# Patient Record
Sex: Male | Born: 1965 | Race: White | Hispanic: No | Marital: Married | Smoking: Never smoker
Health system: Southern US, Community
[De-identification: ages and names within clinical notes are randomized; demographics above are authoritative.]

---
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
## 1997-03-02 HISTORY — PX: HAND SURGERY: SHX662

## 2009-04-04 ENCOUNTER — Encounter: Admission: RE | Admit: 2009-04-04 | Discharge: 2009-04-04 | Payer: Self-pay | Admitting: Family Medicine

## 2010-03-23 ENCOUNTER — Encounter: Payer: Self-pay | Admitting: Family Medicine

## 2013-11-16 ENCOUNTER — Other Ambulatory Visit (HOSPITAL_BASED_OUTPATIENT_CLINIC_OR_DEPARTMENT_OTHER): Payer: Self-pay | Admitting: Family Medicine

## 2013-11-16 ENCOUNTER — Ambulatory Visit (HOSPITAL_BASED_OUTPATIENT_CLINIC_OR_DEPARTMENT_OTHER): Payer: PRIVATE HEALTH INSURANCE

## 2013-11-16 DIAGNOSIS — R3129 Other microscopic hematuria: Secondary | ICD-10-CM

## 2013-11-17 ENCOUNTER — Ambulatory Visit (HOSPITAL_BASED_OUTPATIENT_CLINIC_OR_DEPARTMENT_OTHER)
Admission: RE | Admit: 2013-11-17 | Discharge: 2013-11-17 | Disposition: A | Discharge status: Home / Self Care | Payer: PRIVATE HEALTH INSURANCE | Source: Ambulatory Visit | Attending: Family Medicine | Admitting: Family Medicine

## 2013-11-17 DIAGNOSIS — K5730 Diverticulosis of large intestine without perforation or abscess without bleeding: Secondary | ICD-10-CM | POA: Diagnosis not present

## 2013-11-17 DIAGNOSIS — M549 Dorsalgia, unspecified: Secondary | ICD-10-CM | POA: Diagnosis not present

## 2013-11-17 DIAGNOSIS — R3129 Other microscopic hematuria: Secondary | ICD-10-CM | POA: Diagnosis present

## 2017-12-28 ENCOUNTER — Encounter: Payer: Self-pay | Admitting: Cardiology

## 2017-12-28 ENCOUNTER — Ambulatory Visit (INDEPENDENT_AMBULATORY_CARE_PROVIDER_SITE_OTHER): Payer: 59 | Admitting: Cardiology

## 2017-12-28 VITALS — BP 115/75 | HR 73 | Ht 71.0 in | Wt 160.6 lb

## 2017-12-28 DIAGNOSIS — Z7189 Other specified counseling: Secondary | ICD-10-CM

## 2017-12-28 DIAGNOSIS — E7801 Familial hypercholesterolemia: Secondary | ICD-10-CM | POA: Diagnosis not present

## 2017-12-28 DIAGNOSIS — Z8249 Family history of ischemic heart disease and other diseases of the circulatory system: Secondary | ICD-10-CM

## 2017-12-28 MED ORDER — PRAVASTATIN SODIUM 20 MG PO TABS: 20.0000 mg | ORAL_TABLET | Freq: Every evening | ORAL | 0 refills | Status: DC

## 2017-12-28 NOTE — Patient Instructions (Addendum)
Medication Instructions:  Start: Pravastatin 20 mg   If you need a refill on your cardiac medications before your next appointment, please call your pharmacy.   Lab work: Your physician recommends that you return for lab work today (CRP, Lipid, LPA)  Your physician recommends that you return for lab work in 3 months (Lipid)   If you have labs (blood work) drawn today and your tests are completely normal, you will receive your results only by: Marland Kitchen MyChart Message (if you have MyChart) OR . A paper copy in the mail If you have any lab test that is abnormal or we need to change your treatment, we will call you to review the results.  Testing/Procedures: Calcium Score 1126 [address]   Follow-Up: At [facility], you and your health needs are our priority.  As part of our continuing mission to provide you with exceptional heart care, we have created designated Provider Care Teams.  These Care Teams include your primary Cardiologist (physician) and Advanced Practice Providers (APPs -  Physician Assistants and Nurse Practitioners) who all work together to provide you with the care you need, when you need it. You will need a follow up appointment in 3 months.  Please call our office 2 months in advance to schedule this appointment.  You may see Dr. Cristal Deer or one of the following Advanced Practice Providers on your designated Care Team:   Theodore Demark, PA-C . Joni Reining, DNP, ANP  Any Other Special Instructions Will Be Listed Below (If Applicable).

## 2017-12-28 NOTE — Progress Notes (Signed)
Cardiology Office Note:    Date:  12/28/2017   ID:  Martin Werner, DOB 01/03/1966, MRN [id number]  PCP:  Joycelyn Rua, MD  Cardiologist:  Jodelle Red, MD PhD  Referring MD: Joycelyn Rua, MD   CC: family history, prevention discussion  History of Present Illness:    Martin Werner is a 52 y.o. male without significant PMH who is seen as a new consult at the request of Joycelyn Rua, MD for the evaluation and management of primary prevention.  Per notes from Dr. Lenise Arena, he has a family history of heart disease. His brother has stents, and his father had 3V CABG, passed away at age 15. He wanted a stress test and EKG done for further evaluation. He himself has no personal history of heart disease. Lipids done at the office show Tchol 249, TG 78, HDL 54, LDL 180. No treatment for hypertension, no history of diabetes.  Mr. Harte brings with him a history of lipid results, all elevated and in agreement with recent panel results. He is very healthy, eating fish and fruits/vegetables predominantly. He used to run regularly, and while he has cut back on this, he is still very active. He has a very significant family history of CAD and elevated lipids. Father had a massive heart attack at age 23. His brother was first 31 when evaluated for MI, but had a later MI several years later and now has stents. Unfortunately his brother also now has bone cancer.  He is very concerned about his health. He has young children and wants to be with them and active for a long time.  He denies chest pain, shortness of breath, PND, orthopnea, LE edema, syncope, or palpitations. No history of xanthelasmas.  We discussed extensively how plaques form, how MI's occur, how family history and elevated cholesterol affect these risks, stratification options, and recommendations for management. All questions answered.  History reviewed. No pertinent past medical history.  Past Surgical History:  Procedure  Laterality Date  . HAND SURGERY  1999  . TONSILLECTOMY  1999    Current Medications: Current Outpatient Medications on File Prior to Visit  Medication Sig  . Ibuprofen (ADVIL) 200 MG CAPS Take 1 capsule by mouth as needed.   No current facility-administered medications on file prior to visit.      Allergies:   Oxycodone-acetaminophen and Tyloxapol   Social History   Socioeconomic History  . Marital status: Married    Spouse name: Not on file  . Number of children: Not on file  . Years of education: Not on file  . Highest education level: Not on file  Occupational History  . Not on file  Social Needs  . Financial resource strain: Not on file  . Food insecurity:    Worry: Not on file    Inability: Not on file  . Transportation needs:    Medical: Not on file    Non-medical: Not on file  Tobacco Use  . Smoking status: Never Smoker  . Smokeless tobacco: Never Used  Substance and Sexual Activity  . Alcohol use: Not on file  . Drug use: Not on file  . Sexual activity: Not on file  Lifestyle  . Physical activity:    Days per week: Not on file    Minutes per session: Not on file  . Stress: Not on file  Relationships  . Social connections:    Talks on phone: Not on file    Gets together: Not on file  Attends religious service: Not on file    Active member of club or organization: Not on file    Attends meetings of clubs or organizations: Not on file    Relationship status: Not on file  Other Topics Concern  . Not on file  Social History Narrative  . Not on file     Family History: The patient's family history includes Bone cancer in his brother; Breast cancer in his mother; COPD in his mother; Emphysema in his mother; Heart disease in his father; Hypothyroidism in his mother; Macular degeneration in his mother; Osteoporosis in his mother; Skin cancer in his mother; Stroke in his mother.  ROS:   Please see the history of present illness.  Additional pertinent  ROS:  Constitutional: Negative for chills, fever, night sweats, unintentional weight loss  HENT: Negative for ear pain and hearing loss.   Eyes: Negative for loss of vision and eye pain.  Respiratory: Negative for cough, sputum, shortness of breath, wheezing.   Cardiovascular: Negative for chest pain, palpitations , PND, orthopnea, lower extremity edema and claudication.  Gastrointestinal: Negative for abdominal pain, melena, and hematochezia.  Genitourinary: Negative for dysuria and hematuria.  Musculoskeletal: Negative for falls and myalgias.  Skin: Negative for itching and rash.  Neurological: Negative for focal weakness, focal sensory changes and loss of consciousness.  Endo/Heme/Allergies: Does not bruise/bleed easily.    EKGs/Labs/Other Studies Reviewed:    The following studies were reviewed today: Notes reviewed from Dr. Lenise Arena office  EKG:  EKG is ordered today.  The ekg ordered today demonstrates normal sinus rhytm  Recent Labs: No results found for requested labs within last 8760 hours.  Recent Lipid Panel No results found for: CHOL, TRIG, HDL, CHOLHDL, VLDL, LDLCALC, LDLDIRECT  Physical Exam:    VS:  BP 115/75 (BP Location: Left Arm)   Pulse 73   Ht 5\' 11"  (1.803 m)   Wt 160 lb 9.6 oz (72.8 kg)   BMI 22.40 kg/m     Wt Readings from Last 3 Encounters:  12/28/17 160 lb 9.6 oz (72.8 kg)     GEN: Well nourished, well developed in no acute distress HEENT: Normal NECK: No JVD; No carotid bruits LYMPHATICS: No lymphadenopathy CARDIAC: regular rhythm, normal S1 and S2, no murmurs, rubs, gallops. Radial and DP pulses 2+ bilaterally. RESPIRATORY:  Clear to auscultation without rales, wheezing or rhonchi  ABDOMEN: Soft, non-tender, non-distended MUSCULOSKELETAL:  No edema; No deformity  SKIN: Warm and dry NEUROLOGIC:  Alert and oriented x 3 PSYCHIATRIC:  Normal affect   ASSESSMENT:    1. Familial hypercholesterolemia   2. Family history of coronary  arteriosclerosis   3. Counseling on health promotion and disease prevention    PLAN:    1. Hypercholesterolemia, consistent with heterozygous familial hypercholesterolemia, and family history of CAD: even with excellent diet and lifestyle, his LDL is 180. His father and brother have both had Mis and elevated cholesterol. No one has had genetic testing as far as he knows.   -he is asymptomatic, so stress testing not indicated at this time  -he would like further risk stratification for CAD/MI. Discussed coronary calcium score, hs-CRP, and lp (a). He is amenable to getting these tests to guide intensity of therapy  -we discussed the mechanism of statins as well as pros/cons. He is concerned about myalgias but also wants to avoid symptomatic CAD. For now, we will start with lower dose pravastatin and monitor for tolerance and response  -if his risk stratification testing comes  out elevated, will increase the intensity of statin therapy  -if he cannot tolerate statins or cannot achieve appropriate lipid lowering, will refer to lipid clinic for evaluation for PCSK9  2. Primary prevention: he is exceeding lifestyle goals, but reviewed below -recommend heart healthy/Mediterranean diet, with whole grains, fruits, vegetable, fish, lean meats, nuts, and olive oil. Limit salt. -recommend moderate walking, 3-5 times/week for 30-50 minutes each session. Aim for at least 150 minutes.week. Goal should be pace of 3 miles/hours, or walking 1.5 miles in 30 minutes -recommend avoidance of tobacco products. Avoid excess alcohol. -Additional risk factor control:  -Diabetes: A1c is N/A, no risk factors  -Lipids: as above  -Blood pressure control: at goal on no meds  -Weight: ideal range ASCVD risk is 3.6% in 10 years, 50% lifetime risk.   We spent >40 minutes discussing his family history, personal risk factors, pathogenesis of lipid/plaque formation and MI, and options for risk stratification and  management.  Plan for follow up: 3 mos, recheck lipids on pravastatin  Medication Adjustments/Labs and Tests Ordered: Current medicines are reviewed at length with the patient today.  Concerns regarding medicines are outlined above.  Orders Placed This Encounter  Procedures  . CT CARDIAC SCORING  . Lipid Panel With LDL/HDL Ratio  . Lipoprotein A (LPA)  . CRP High sensitivity  . Lipid panel  . EKG 12-Lead   Meds ordered this encounter  Medications  . pravastatin (PRAVACHOL) 20 MG tablet    Sig: Take 1 tablet (20 mg total) by mouth every evening.    Dispense:  90 tablet    Refill:  0    Patient Instructions  Medication Instructions:  Start: Pravastatin 20 mg   If you need a refill on your cardiac medications before your next appointment, please call your pharmacy.   Lab work: Your physician recommends that you return for lab work today (CRP, Lipid, LPA)  Your physician recommends that you return for lab work in 3 months (Lipid)   If you have labs (blood work) drawn today and your tests are completely normal, you will receive your results only by: Marland Kitchen MyChart Message (if you have MyChart) OR . A paper copy in the mail If you have any lab test that is abnormal or we need to change your treatment, we will call you to review the results.  Testing/Procedures: Calcium Score [address]   Follow-Up: At [facility], you and your health needs are our priority.  As part of our continuing mission to provide you with exceptional heart care, we have created designated Provider Care Teams.  These Care Teams include your primary Cardiologist (physician) and Advanced Practice Providers (APPs -  Physician Assistants and Nurse Practitioners) who all work together to provide you with the care you need, when you need it. You will need a follow up appointment in 3 months.  Please call our office 2 months in advance to schedule this appointment.  You may see Dr. Cristal Deer  or one of the following Advanced Practice Providers on your designated Care Team:   Theodore Demark, PA-C . Joni Reining, DNP, ANP  Any Other Special Instructions Will Be Listed Below (If Applicable).      Signed, Jodelle Red, MD PhD 12/28/2017 1:32 PM    [facility]

## 2017-12-29 LAB — LIPID PANEL WITH LDL/HDL RATIO
CHOLESTEROL TOTAL: 236 mg/dL — AB (ref 100–199)
HDL: 52 mg/dL (ref >39)
LDL Calculated: 157 mg/dL — ABNORMAL HIGH (ref 0–99)
LDL/HDL RATIO: 3 ratio (ref 0.0–3.6)
Triglycerides: 135 mg/dL (ref 0–149)
VLDL CHOLESTEROL CAL: 27 mg/dL (ref 5–40)

## 2017-12-29 LAB — HIGH SENSITIVITY CRP: CRP HIGH SENSITIVITY: 0.83 mg/L (ref 0.00–3.00)

## 2017-12-29 LAB — LIPOPROTEIN A (LPA): LIPOPROTEIN (A): 97.9 nmol/L — AB (ref <75.0)

## 2017-12-30 ENCOUNTER — Telehealth: Payer: Self-pay | Admitting: Cardiology

## 2017-12-30 NOTE — Telephone Encounter (Signed)
Pt updated with test results along with Dr. Di Kindle recommendation. Pt verbalized understanding. Pt also states he would like Dr. Cristal Deer to know that in her notes it mention both brothers have a heart conditions but the brother who has cancer does not. Pt also states the year he had his tonsillectomy was in 1975. Chart updated.

## 2017-12-30 NOTE — Telephone Encounter (Signed)
Follow up: ° ° °Patient returning call back concerning results. °

## 2018-01-10 ENCOUNTER — Ambulatory Visit (INDEPENDENT_AMBULATORY_CARE_PROVIDER_SITE_OTHER)
Admission: RE | Admit: 2018-01-10 | Discharge: 2018-01-10 | Disposition: A | Discharge status: Home / Self Care | Payer: 59 | Source: Ambulatory Visit | Attending: Cardiology | Admitting: Cardiology

## 2018-01-10 DIAGNOSIS — E7801 Familial hypercholesterolemia: Secondary | ICD-10-CM

## 2018-01-10 DIAGNOSIS — Z8249 Family history of ischemic heart disease and other diseases of the circulatory system: Secondary | ICD-10-CM

## 2018-03-20 ENCOUNTER — Other Ambulatory Visit: Payer: Self-pay | Admitting: Cardiology

## 2018-03-20 DIAGNOSIS — E7801 Familial hypercholesterolemia: Secondary | ICD-10-CM

## 2018-03-20 DIAGNOSIS — Z8249 Family history of ischemic heart disease and other diseases of the circulatory system: Secondary | ICD-10-CM

## 2018-03-30 ENCOUNTER — Ambulatory Visit: Payer: 59 | Admitting: Cardiology

## 2018-04-06 ENCOUNTER — Ambulatory Visit (INDEPENDENT_AMBULATORY_CARE_PROVIDER_SITE_OTHER): Payer: 59 | Admitting: Cardiology

## 2018-04-06 ENCOUNTER — Encounter: Payer: Self-pay | Admitting: Cardiology

## 2018-04-06 VITALS — BP 98/74 | HR 76 | Ht 72.0 in | Wt 160.0 lb

## 2018-04-06 DIAGNOSIS — Z712 Person consulting for explanation of examination or test findings: Secondary | ICD-10-CM | POA: Diagnosis not present

## 2018-04-06 DIAGNOSIS — E7801 Familial hypercholesterolemia: Secondary | ICD-10-CM

## 2018-04-06 DIAGNOSIS — Z7189 Other specified counseling: Secondary | ICD-10-CM

## 2018-04-06 DIAGNOSIS — Z79899 Other long term (current) drug therapy: Secondary | ICD-10-CM

## 2018-04-06 LAB — HEPATIC FUNCTION PANEL
ALBUMIN: 4.9 g/dL (ref 4.0–5.0)
ALT: 30 IU/L (ref 0–44)
AST: 22 IU/L (ref 0–40)
Alkaline Phosphatase: 64 IU/L (ref 39–117)
Bilirubin Total: 1.5 mg/dL — ABNORMAL HIGH (ref 0.0–1.2)
Bilirubin, Direct: 0.26 mg/dL (ref 0.00–0.40)
TOTAL PROTEIN: 7.2 g/dL (ref 6.0–8.5)

## 2018-04-06 LAB — LIPID PANEL
CHOLESTEROL TOTAL: 204 mg/dL — AB (ref 100–199)
Chol/HDL Ratio: 3.6 ratio (ref 0.0–5.0)
HDL: 56 mg/dL (ref >39)
LDL CALC: 132 mg/dL — AB (ref 0–99)
Triglycerides: 78 mg/dL (ref 0–149)
VLDL CHOLESTEROL CAL: 16 mg/dL (ref 5–40)

## 2018-04-06 NOTE — Patient Instructions (Signed)
Medication Instructions:  Your Physician recommend you continue on your current medication as directed.    If you need a refill on your cardiac medications before your next appointment, please call your pharmacy.   Lab work: Your physician recommends that you return for lab work today (lipid, LFT)  If you have labs (blood work) drawn today and your tests are completely normal, you will receive your results only by: . MyChart Message (if you have MyChart) OR . A paper copy in the mail If you have any lab test that is abnormal or we need to change your treatment, we will call you to review the results.  Testing/Procedures: None  Follow-Up: At [facility], you and your health needs are our priority.  As part of our continuing mission to provide you with exceptional heart care, we have created designated Provider Care Teams.  These Care Teams include your primary Cardiologist (physician) and Advanced Practice Providers (APPs -  Physician Assistants and Nurse Practitioners) who all work together to provide you with the care you need, when you need it. You will need a follow up appointment in 1 years.  Please call our office 2 months in advance to schedule this appointment.  You may see Bridgette Christopher, MD or one of the following Advanced Practice Providers on your designated Care Team:   Rhonda Barrett, PA-C . Kathryn Lawrence, DNP, ANP       

## 2018-04-06 NOTE — Progress Notes (Signed)
Cardiology Office Note:    Date:  04/06/2018   ID:  [name], DOB February 16, 1966, MRN [id number]  PCP:  Joycelyn Rua, MD  Cardiologist:  Jodelle Red, MD PhD  Referring MD: Joycelyn Rua, MD   CC: family history, prevention discussion  History of Present Illness:    Martin Werner is a 53 y.o. male seen for follow up of family history of CAD and primary prevention.  Cardiac history:  Strong family history, brother had stents (first MI age 70, stents after MI several years later), and his father had massive MI at age 20, then 3V CABG, passed away at age 48. Personal lipids:  Tchol 249, TG 78, HDL 54, LDL 180. No treatment for hypertension, no history of diabetes. At initial visit, hs-CRP normal at 0.83, lp(a) elevated at 97.9. Calcium score of 33, which was 81st percentile for age/gender control group.  Today: tolerating pravastatin. No increase in his baseline aches and pains. Brother died of cancer in 2023-02-26, has been a stressful and busy time. Did well with colonoscopy last week, no issues. Reviewed results of workup after prior visit, plans for monitoring and maintenance.  Past Surgical History:  Procedure Laterality Date  . HAND SURGERY  1999  . TONSILLECTOMY  1975    Current Medications: Current Outpatient Medications on File Prior to Visit  Medication Sig  . Ibuprofen (ADVIL) 200 MG CAPS Take 1 capsule by mouth as needed.  . pravastatin (PRAVACHOL) 20 MG tablet TAKE 1 TABLET BY MOUTH EVERY DAY IN THE EVENING   No current facility-administered medications on file prior to visit.      Allergies:   Oxycodone-acetaminophen and Tyloxapol   Social History   Socioeconomic History  . Marital status: Married    Spouse name: Not on file  . Number of children: Not on file  . Years of education: Not on file  . Highest education level: Not on file  Occupational History  . Not on file  Social Needs  . Financial resource strain: Not on file  . Food insecurity:   Worry: Not on file    Inability: Not on file  . Transportation needs:    Medical: Not on file    Non-medical: Not on file  Tobacco Use  . Smoking status: Never Smoker  . Smokeless tobacco: Never Used  Substance and Sexual Activity  . Alcohol use: Not on file  . Drug use: Not on file  . Sexual activity: Not on file  Lifestyle  . Physical activity:    Days per week: Not on file    Minutes per session: Not on file  . Stress: Not on file  Relationships  . Social connections:    Talks on phone: Not on file    Gets together: Not on file    Attends religious service: Not on file    Active member of club or organization: Not on file    Attends meetings of clubs or organizations: Not on file    Relationship status: Not on file  Other Topics Concern  . Not on file  Social History Narrative  . Not on file     Family History: The patient's family history includes Bone cancer in his brother; Breast cancer in his mother; COPD in his mother; Emphysema in his mother; Heart disease in his father; Hypothyroidism in his mother; Macular degeneration in his mother; Osteoporosis in his mother; Skin cancer in his mother; Stroke in his mother.  ROS:   Please see the history  of present illness.  Additional pertinent ROS:  Constitutional: Negative for chills, fever, night sweats, unintentional weight loss  HENT: Negative for ear pain and hearing loss.   Eyes: Negative for loss of vision and eye pain.  Respiratory: Negative for cough, sputum, shortness of breath, wheezing.   Cardiovascular: Negative for chest pain, palpitations , PND, orthopnea, lower extremity edema and claudication.  Gastrointestinal: Negative for abdominal pain, melena, and hematochezia.  Genitourinary: Negative for dysuria and hematuria.  Musculoskeletal: Negative for falls and myalgias.  Skin: Negative for itching and rash.  Neurological: Negative for focal weakness, focal sensory changes and loss of consciousness.    Endo/Heme/Allergies: Does not bruise/bleed easily.    EKGs/Labs/Other Studies Reviewed:    The following studies were reviewed today: Notes reviewed from Dr. Lenise Arena office  EKG:  EKG is personally reviewed today.  The ekg ordered 12/28/17 demonstrates normal sinus rhytm  Recent Labs: No results found for requested labs within last 8760 hours.  Recent Lipid Panel    Component Value Date/Time   CHOL 236 (H) 12/28/2017 1435   TRIG 135 12/28/2017 1435   HDL 52 12/28/2017 1435   LDLCALC 157 (H) 12/28/2017 1435    Physical Exam:    VS:  BP 98/74 (BP Location: Left Arm, Patient Position: Sitting, Cuff Size: Normal)   Pulse 76   Ht 6' (1.829 m)   Wt 160 lb (72.6 kg)   BMI 21.70 kg/m     Wt Readings from Last 3 Encounters:  04/06/18 160 lb (72.6 kg)  12/28/17 160 lb 9.6 oz (72.8 kg)     GEN: Well nourished, well developed in no acute distress HEENT: Normal NECK: No JVD; No carotid bruits LYMPHATICS: No lymphadenopathy CARDIAC: regular rhythm, normal S1 and S2, no murmurs, rubs, gallops. Radial and DP pulses 2+ bilaterally. RESPIRATORY:  Clear to auscultation without rales, wheezing or rhonchi  ABDOMEN: Soft, non-tender, non-distended MUSCULOSKELETAL:  No edema; No deformity  SKIN: Warm and dry NEUROLOGIC:  Alert and oriented x 3 PSYCHIATRIC:  Normal affect   ASSESSMENT:    No diagnosis found. PLAN:    1. Hypercholesterolemia, consistent with heterozygous familial hypercholesterolemia, and family history of CAD: -even with excellent lifestyle, baseline LDL has been 180 -father and brother have both had Mis and elevated cholesterol. -No one has had genetic testing as far as he knows.  -calcium score of 33, 81st percentile for age. Reviewed test results today in person -hs-CRP is normal 0.83 -lp(a) was elevated at 97.9 -tolerating low dose pravastatin, recheck lipids -if he cannot tolerate statins or cannot achieve appropriate lipid lowering, will refer to lipid  clinic for evaluation for PCSK9  2. Primary prevention: he is exceeding lifestyle goals, but reviewed below -recommend heart healthy/Mediterranean diet, with whole grains, fruits, vegetable, fish, lean meats, nuts, and olive oil. Limit salt. -recommend moderate walking, 3-5 times/week for 30-50 minutes each session. Aim for at least 150 minutes.week. Goal should be pace of 3 miles/hours, or walking 1.5 miles in 30 minutes -recommend avoidance of tobacco products. Avoid excess alcohol. -Additional risk factor control:  -Diabetes: A1c is N/A, no risk factors  -Lipids: as above  -Blood pressure control: at goal on no meds  -Weight: ideal range  UPDATED: lipids show modest improvement in LDL (from 157 to 132). Moderate goal LDL around 100 given presence of coronary calcification but score <100. Stringent control would be LDL <70. Will trial higher dose of pravastatin, but I suspect that this will not get to target. Likely will  need a higher intensity statin at follow up.  Plan for follow up: 3 mos lipid recheck, then follow up in 1 year or sooner PRN  TIME SPENT WITH PATIENT: >25 minutes of direct patient care. More than 50% of that time was spent on coordination of care and counseling regarding test results, recommended management and prevention counseling.  Jodelle RedBridgette Carlyn Mullenbach, MD, PhD [place]  [facility]   Medication Adjustments/Labs and Tests Ordered: Current medicines are reviewed at length with the patient today.  Concerns regarding medicines are outlined above.  Orders Placed This Encounter  Procedures  . Lipid panel  . Hepatic function panel   No orders of the defined types were placed in this encounter.   Patient Instructions  Medication Instructions:  Your Physician recommend you continue on your current medication as directed.    If you need a refill on your cardiac medications before your next appointment, please call your pharmacy.   Lab work: Your physician  recommends that you return for lab work today (lipid, LFT)  If you have labs (blood work) drawn today and your tests are completely normal, you will receive your results only by: Marland Kitchen. MyChart Message (if you have MyChart) OR . A paper copy in the mail If you have any lab test that is abnormal or we need to change your treatment, we will call you to review the results.  Testing/Procedures: None  Follow-Up: At [facility], you and your health needs are our priority.  As part of our continuing mission to provide you with exceptional heart care, we have created designated Provider Care Teams.  These Care Teams include your primary Cardiologist (physician) and Advanced Practice Providers (APPs -  Physician Assistants and Nurse Practitioners) who all work together to provide you with the care you need, when you need it. You will need a follow up appointment in 1 years.  Please call our office 2 months in advance to schedule this appointment.  You may see Jodelle RedBridgette Cimone Fahey, MD or one of the following Advanced Practice Providers on your designated Care Team:   Theodore DemarkRhonda Barrett, PA-C . Joni ReiningKathryn Lawrence, DNP, ANP       Signed, Jodelle RedBridgette Manasseh Pittsley, MD PhD 04/06/2018 8:02 AM    [facility]

## 2018-04-08 ENCOUNTER — Other Ambulatory Visit: Payer: Self-pay

## 2018-04-08 DIAGNOSIS — Z8249 Family history of ischemic heart disease and other diseases of the circulatory system: Secondary | ICD-10-CM

## 2018-04-08 DIAGNOSIS — E7801 Familial hypercholesterolemia: Secondary | ICD-10-CM

## 2018-04-08 MED ORDER — PRAVASTATIN SODIUM 40 MG PO TABS: 40.0000 mg | ORAL_TABLET | Freq: Every day | ORAL | 11 refills | Status: DC

## 2018-04-12 ENCOUNTER — Encounter: Payer: Self-pay | Admitting: Cardiology

## 2018-06-14 NOTE — Telephone Encounter (Signed)
Can you change Mr. Aydelott pravastatin back to 20 mg dose? He doesn't need a refill yet. And can we have him come to see [place] in 3-4 mos? Thanks!

## 2018-06-15 ENCOUNTER — Other Ambulatory Visit: Payer: Self-pay

## 2018-06-15 DIAGNOSIS — E7801 Familial hypercholesterolemia: Secondary | ICD-10-CM

## 2018-06-15 DIAGNOSIS — Z8249 Family history of ischemic heart disease and other diseases of the circulatory system: Secondary | ICD-10-CM

## 2018-06-15 MED ORDER — PRAVASTATIN SODIUM 20 MG PO TABS: 20.0000 mg | ORAL_TABLET | Freq: Every day | ORAL | 3 refills | Status: DC

## 2018-10-31 ENCOUNTER — Other Ambulatory Visit: Payer: Self-pay

## 2018-10-31 DIAGNOSIS — E7801 Familial hypercholesterolemia: Secondary | ICD-10-CM

## 2018-10-31 DIAGNOSIS — Z8249 Family history of ischemic heart disease and other diseases of the circulatory system: Secondary | ICD-10-CM

## 2018-10-31 MED ORDER — PRAVASTATIN SODIUM 20 MG PO TABS: 20.0000 mg | ORAL_TABLET | Freq: Every day | ORAL | 3 refills | Status: DC

## 2019-04-11 ENCOUNTER — Telehealth: Payer: Self-pay | Admitting: *Deleted

## 2019-04-11 NOTE — Telephone Encounter (Signed)
A message was left, re: his  Visit.

## 2019-04-21 ENCOUNTER — Encounter: Payer: Self-pay | Admitting: Cardiology

## 2019-04-21 ENCOUNTER — Other Ambulatory Visit: Payer: Self-pay

## 2019-04-21 ENCOUNTER — Ambulatory Visit (INDEPENDENT_AMBULATORY_CARE_PROVIDER_SITE_OTHER): Payer: Managed Care, Other (non HMO) | Admitting: Cardiology

## 2019-04-21 VITALS — BP 120/84 | HR 57 | Ht 72.0 in | Wt 165.8 lb

## 2019-04-21 DIAGNOSIS — Z79899 Other long term (current) drug therapy: Secondary | ICD-10-CM | POA: Diagnosis not present

## 2019-04-21 DIAGNOSIS — Z8249 Family history of ischemic heart disease and other diseases of the circulatory system: Secondary | ICD-10-CM | POA: Diagnosis not present

## 2019-04-21 DIAGNOSIS — E7800 Pure hypercholesterolemia, unspecified: Secondary | ICD-10-CM

## 2019-04-21 DIAGNOSIS — I2510 Atherosclerotic heart disease of native coronary artery without angina pectoris: Secondary | ICD-10-CM

## 2019-04-21 DIAGNOSIS — E7801 Familial hypercholesterolemia: Secondary | ICD-10-CM

## 2019-04-21 IMAGING — CT CT HEART SCORING
2 series · 16 of 20 positions shown, 18 images · non-contrast
Comparison: None.

Addendum:
EXAM:
OVER-READ INTERPRETATION  CT CHEST

The following report is an over-read performed by radiologist Dr.
Zeluis Marouco [REDACTED] on 01/10/2018. This
over-read does not include interpretation of cardiac or coronary
anatomy or pathology. The coronary calcium score interpretation by
the cardiologist is attached.
CLINICAL DATA: Risk stratification
Coronary Calcium Score
TECHNIQUE: The patient was scanned on a Siemens Force scanner. Axial
non-contrast 3 mm slices were carried out through the heart. The
data set was analyzed on a dedicated work station and scored using
the Agatson method.

[Series 2: casc 3.0 i36f 2 bestdiast 71 % · axial · 0.36mm/px · z∈[-339,-201]mm · 8 of 60 slices shown, 10 images]
[im 7/60  vessel]
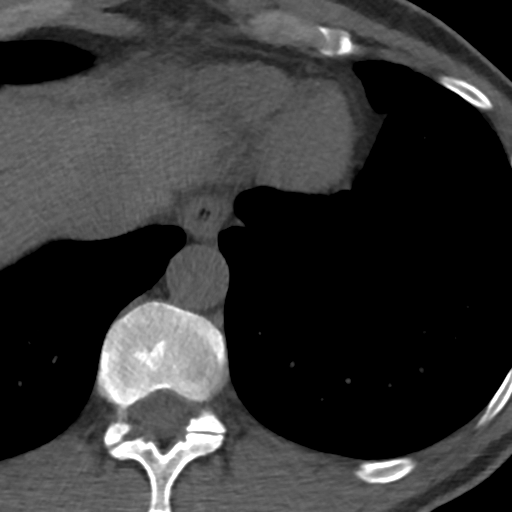
[im 7/60  lung]
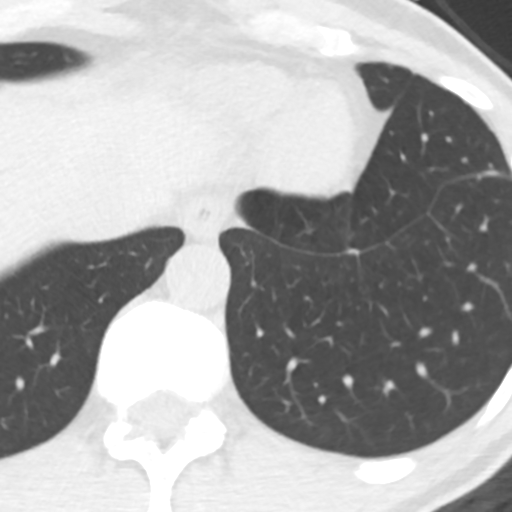
[im 14/60  vessel]
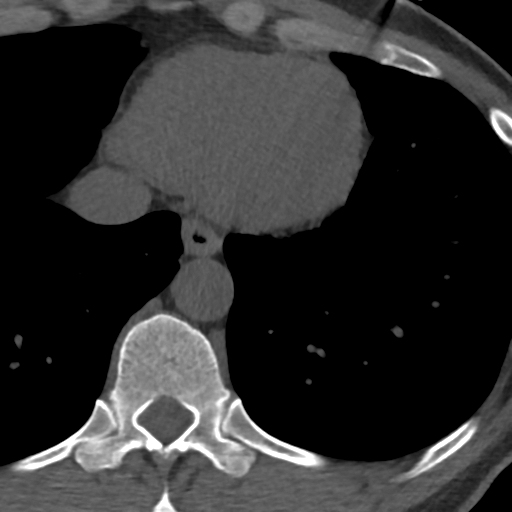
[im 20/60  vessel]
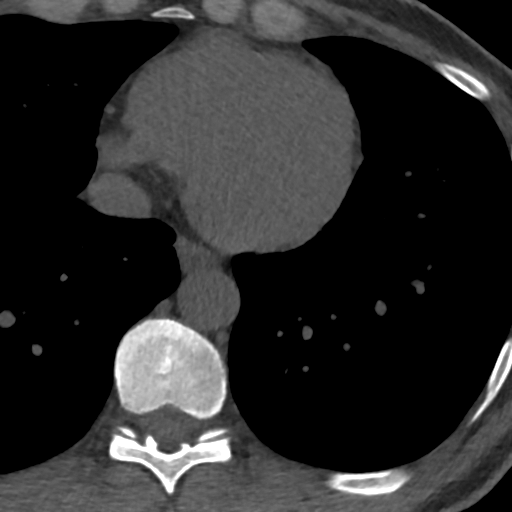
[im 27/60  vessel]
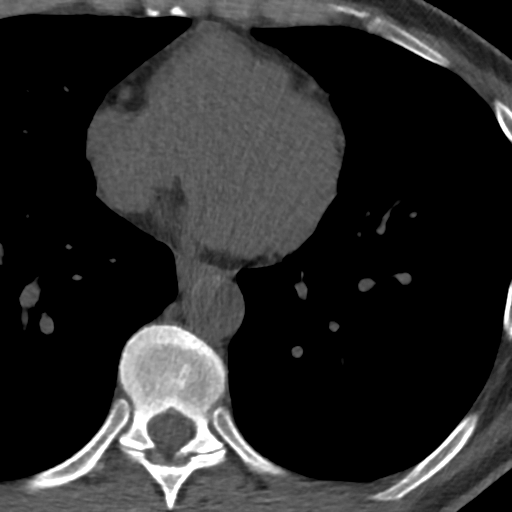
[im 33/60  vessel]
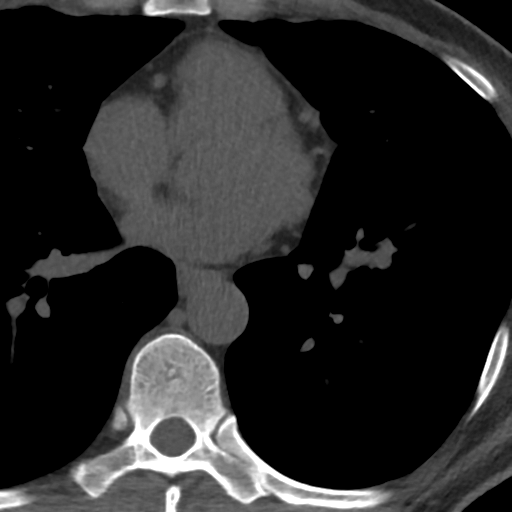
[im 33/60  lung]
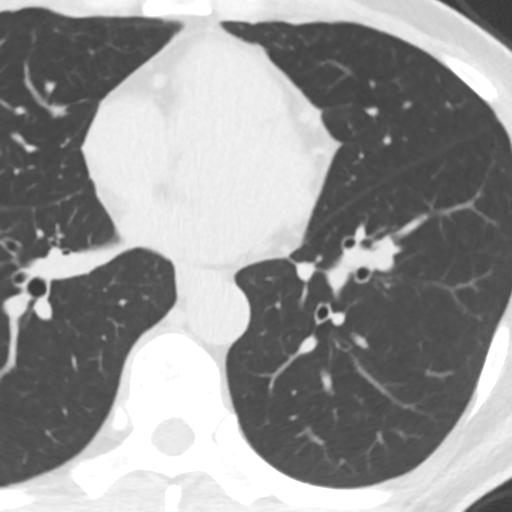
[im 40/60  vessel]
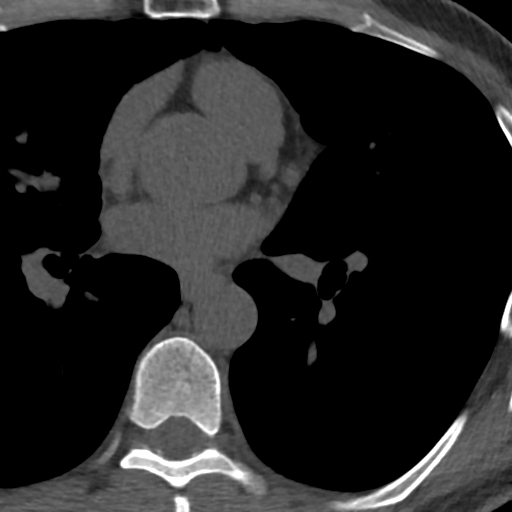
[im 46/60  vessel]
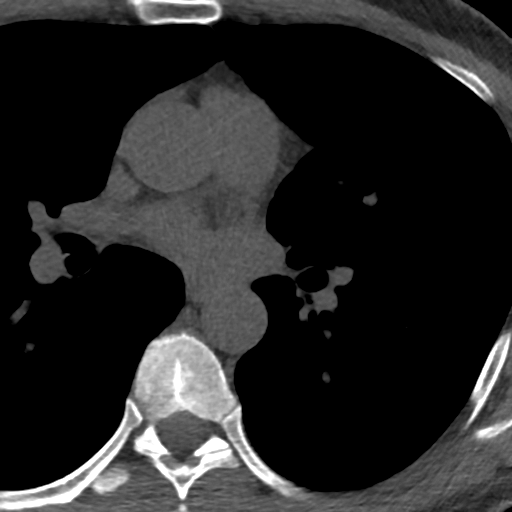
[im 53/60  vessel]
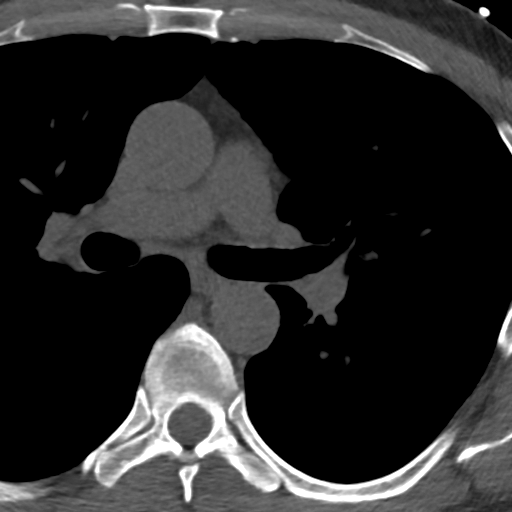

[Series 4: lung st 72 % · axial · 0.68mm/px · z∈[-338,-203]mm · 8 of 59 slices shown]
[im 7/59  lung]
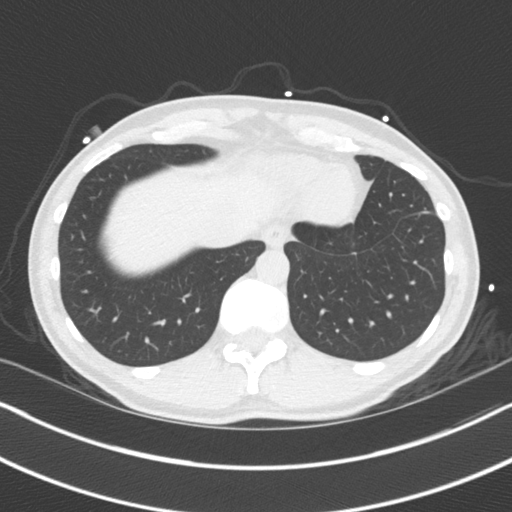
[im 13/59  lung]
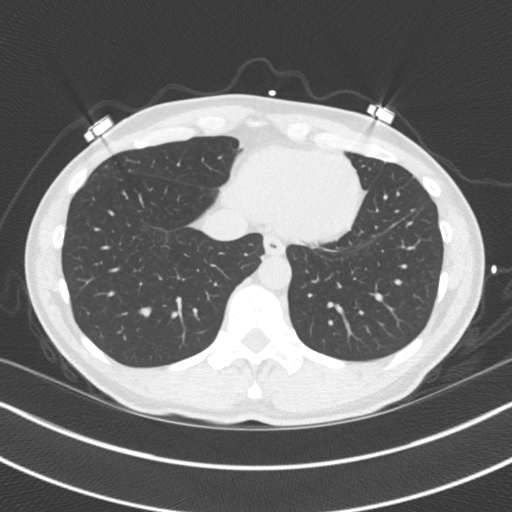
[im 20/59  lung]
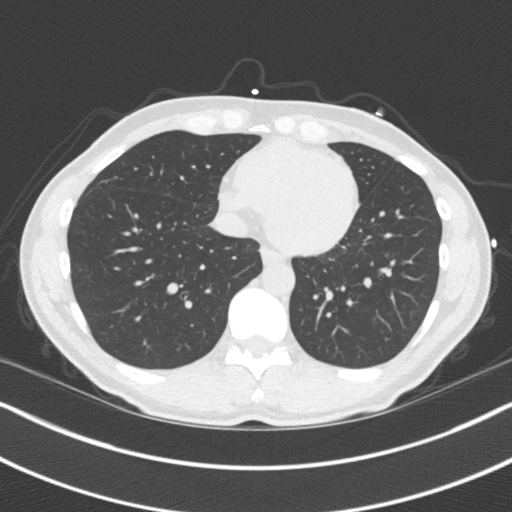
[im 26/59  lung]
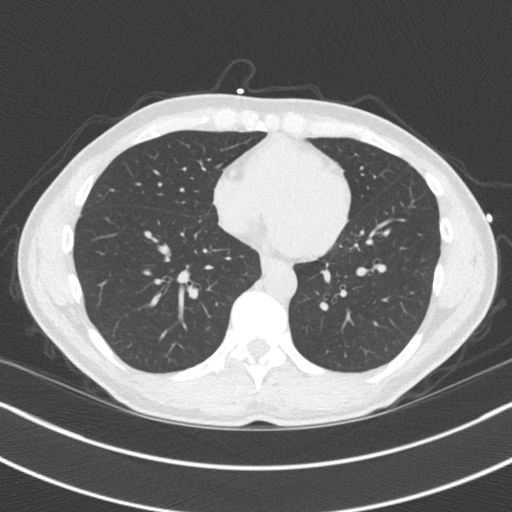
[im 33/59  lung]
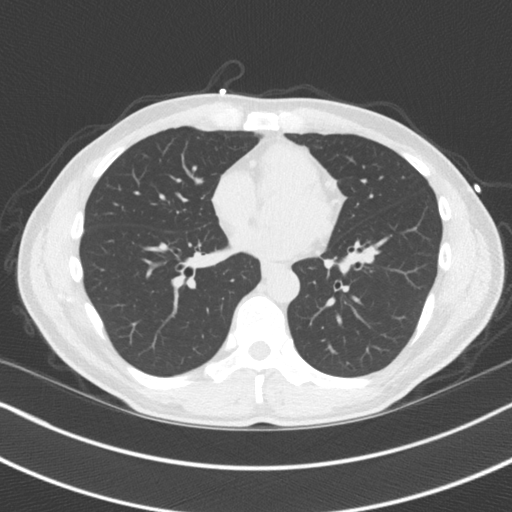
[im 39/59  lung]
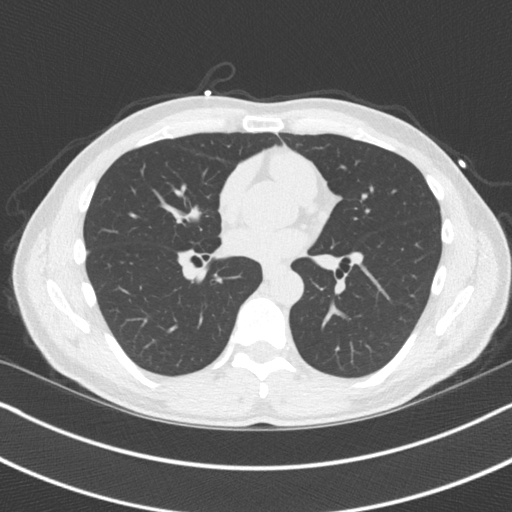
[im 46/59  lung]
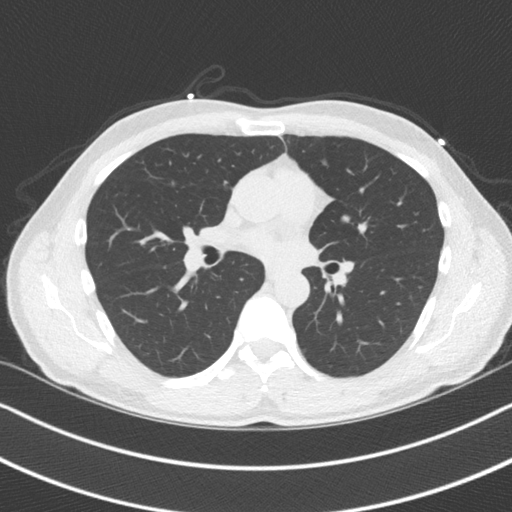
[im 52/59  lung]
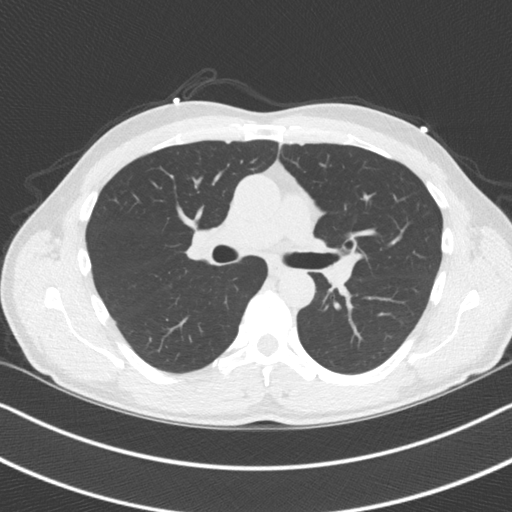

[16 of 20 positions shown; findings below may reference images not displayed]

FINDINGS: Vascular: Heart is normal size.  Visualized aorta is normal caliber.

Mediastinum/Nodes: No adenopathy in the lower mediastinum or hila.

Lungs/Pleura: No confluent opacities or effusions.

Upper Abdomen: Imaging into the upper abdomen shows no acute
findings.

Musculoskeletal: Chest wall soft tissues are unremarkable. No acute
bony abnormality.
IMPRESSION: No acute or significant extracardiac abnormality.
FINDINGS: Non-cardiac: See separate report from [REDACTED].

Ascending Aorta: Normal size, no calcifications.

Pericardium: Normal.

Coronary arteries: Normal origin.
IMPRESSION: Coronary calcium score of 33. This was 81 percentile for age and sex
matched control.

*** End of Addendum ***

## 2019-04-21 MED ORDER — EZETIMIBE 10 MG PO TABS: 10.0000 mg | ORAL_TABLET | Freq: Every day | ORAL | 11 refills | Status: DC

## 2019-04-21 NOTE — Progress Notes (Signed)
Cardiology Office Note:    Date:  04/21/2019   ID:  Lonell Face, DOB 1965-12-25, MRN [id number]  PCP:  Orpah Melter, MD  Cardiologist:  Buford Dresser, MD PhD  Referring MD: Orpah Melter, MD   CC: follow up  History of Present Illness:    Martin Werner is a 54 y.o. male seen for follow up of family history of CAD and primary prevention.  Cardiac history:  Strong family history, brother had stents (first MI age 61, stents after MI several years later), and his father had massive MI at age 66, then 3V CABG, passed away at age 17. Personal lipids:  Tchol 249, TG 78, HDL 54, LDL 180. No treatment for hypertension, no history of diabetes. At initial visit, hs-CRP normal at 0.83, lp(a) elevated at 97.9. Calcium score of 33, which was 81st percentile for age/gender control group.  Today: Tolerating 20 mg pravastatin without issue. See notes in chart, had myalgia on 40 mg dose that improve with dropping back to 20 mg dose. Last LDL not at goal, was 122 in 12/2018. Discussed options for this, see below. Of note, brother with prior MI on lovastatin high dose as he did not tolerate other statins. We discussed the genetics of statin intolerance today.  Reviewed his calcium score. Reviewed Ca score and mortality graphs together today.  Denies chest pain, shortness of breath at rest or with normal exertion. No PND, orthopnea, LE edema or unexpected weight gain. No syncope or palpitations.  Past Surgical History:  Procedure Laterality Date  . HAND SURGERY  1999  . TONSILLECTOMY  1975    Current Medications: Current Outpatient Medications on File Prior to Visit  Medication Sig  . Ibuprofen (ADVIL) 200 MG CAPS Take 1 capsule by mouth as needed.  . pravastatin (PRAVACHOL) 20 MG tablet Take 1 tablet (20 mg total) by mouth daily.   No current facility-administered medications on file prior to visit.     Allergies:   Oxycodone-acetaminophen and Tyloxapol   Social History   Tobacco  Use  . Smoking status: Never Smoker  . Smokeless tobacco: Never Used  Substance Use Topics  . Alcohol use: Not on file  . Drug use: Not on file    Family History: The patient's family history includes Bone cancer in his brother; Breast cancer in his mother; COPD in his mother; Emphysema in his mother; Heart disease in his father; Hypothyroidism in his mother; Macular degeneration in his mother; Osteoporosis in his mother; Skin cancer in his mother; Stroke in his mother.  ROS:   Please see the history of present illness.  Additional pertinent ROS otherwise negative.  EKGs/Labs/Other Studies Reviewed:    The following studies were reviewed today: Ca score 01/10/2018 Coronary calcium score of 33. This was 27 percentile for age and sex matched control.  EKG:  EKG is personally reviewed today.  The ekg ordered today demonstrates normal sinus rhytm  Recent Labs: No results found for requested labs within last 8760 hours.  Recent Lipid Panel    Component Value Date/Time   CHOL 204 (H) 04/06/2018 1056   TRIG 78 04/06/2018 1056   HDL 56 04/06/2018 1056   CHOLHDL 3.6 04/06/2018 1056   LDLCALC 132 (H) 04/06/2018 1056    Physical Exam:    VS:  BP 120/84   Pulse (!) 57   Ht 6' (1.829 m)   Wt 165 lb 12.8 oz (75.2 kg)   SpO2 100%   BMI 22.49 kg/m  Wt Readings from Last 3 Encounters:  04/21/19 165 lb 12.8 oz (75.2 kg)  04/06/18 160 lb (72.6 kg)  12/28/17 160 lb 9.6 oz (72.8 kg)    GEN: Well nourished, well developed in no acute distress HEENT: Normal, moist mucous membranes NECK: No JVD CARDIAC: regular rhythm, normal S1 and S2, no rubs or gallops. No murmur. VASCULAR: Radial and DP pulses 2+ bilaterally. No carotid bruits RESPIRATORY:  Clear to auscultation without rales, wheezing or rhonchi  ABDOMEN: Soft, non-tender, non-distended MUSCULOSKELETAL:  Ambulates independently SKIN: Warm and dry, no edema NEUROLOGIC:  Alert and oriented x 3. No focal neuro deficits  noted. PSYCHIATRIC:  Normal affect   ASSESSMENT:    1. Familial hypercholesterolemia   2. Family history of coronary arteriosclerosis   3. Hypercholesteremia   4. Medication management   5. Coronary artery calcification seen on CT scan    PLAN:    Hypercholesterolemia, consistent with heterozygous familial hypercholesterolemia, and family history of CAD: -even with excellent lifestyle, baseline LDL has been 180 -father and brother have both had Mis and elevated cholesterol. -No one has had genetic testing as far as he knows.  -calcium score of 33, 81st percentile for age. -hs-CRP is normal 0.83 -lp(a) was elevated at 97.9 -tolerating low dose pravastatin, but had myalgia on higher dose -discussed three options: stay on current pravastatin dose and monitor, change to low dose rosuvastatin and see if he tolerates, or add ezetimibe to his current regimen -with known FH and elevated calcium score, would like LDL <100. Will add ezetimibe and recheck lipids in 2 mos -if he cannot tolerate high enough dose statins or cannot achieve appropriate lipid lowering, will refer to lipid clinic for evaluation for PCSK9i  Coronary calcium on CT: needs aggressive prevention -recommend heart healthy/Mediterranean diet, with whole grains, fruits, vegetable, fish, lean meats, nuts, and olive oil. Limit salt. -recommend moderate walking, 3-5 times/week for 30-50 minutes each session. Aim for at least 150 minutes.week. Goal should be pace of 3 miles/hours, or walking 1.5 miles in 30 minutes -recommend avoidance of tobacco products. Avoid excess alcohol. -Additional risk factor control:  -Diabetes: A1c is N/A, no risk factors  -Lipids: as above  -Blood pressure control: at goal on no meds  -Weight: ideal range  Plan for follow up: 2 mos lipid recheck, then follow up in 1 year or sooner PRN  Jodelle Red, MD, PhD [place]  [facility]   Medication Adjustments/Labs and Tests  Ordered: Current medicines are reviewed at length with the patient today.  Concerns regarding medicines are outlined above.  Orders Placed This Encounter  Procedures  . Lipid panel  . EKG 12-Lead   Meds ordered this encounter  Medications  . ezetimibe (ZETIA) 10 MG tablet    Sig: Take 1 tablet (10 mg total) by mouth daily.    Dispense:  30 tablet    Refill:  11    Patient Instructions  Medication Instructions:  Start Zetia 10 mg daily  *If you need a refill on your cardiac medications before your next appointment, please call your pharmacy*  Lab Work: Your physician recommends that you return for lab work in 2 months ( Fasting lipids)  If you have labs (blood work) drawn today and your tests are completely normal, you will receive your results only by: Marland Kitchen MyChart Message (if you have MyChart) OR . A paper copy in the mail If you have any lab test that is abnormal or we need to change your treatment,  we will call you to review the results.  Testing/Procedures: None  Follow-Up: At [facility], you and your health needs are our priority.  As part of our continuing mission to provide you with exceptional heart care, we have created designated Provider Care Teams.  These Care Teams include your primary Cardiologist (physician) and Advanced Practice Providers (APPs -  Physician Assistants and Nurse Practitioners) who all work together to provide you with the care you need, when you need it.  Your next appointment:   1 year(s)  The format for your next appointment:   In Person  Provider:   Jodelle Red, MD     Signed, Jodelle Red, MD PhD 04/21/2019 6:31 PM    [facility]

## 2019-04-21 NOTE — Patient Instructions (Signed)
Medication Instructions:  Start Zetia 10 mg daily  *If you need a refill on your cardiac medications before your next appointment, please call your pharmacy*  Lab Work: Your physician recommends that you return for lab work in 2 months ( Fasting lipids)  If you have labs (blood work) drawn today and your tests are completely normal, you will receive your results only by: Marland Kitchen MyChart Message (if you have MyChart) OR . A paper copy in the mail If you have any lab test that is abnormal or we need to change your treatment, we will call you to review the results.  Testing/Procedures: None  Follow-Up: At [facility], you and your health needs are our priority.  As part of our continuing mission to provide you with exceptional heart care, we have created designated Provider Care Teams.  These Care Teams include your primary Cardiologist (physician) and Advanced Practice Providers (APPs -  Physician Assistants and Nurse Practitioners) who all work together to provide you with the care you need, when you need it.  Your next appointment:   1 year(s)  The format for your next appointment:   In Person  Provider:   Jodelle Red, MD

## 2019-05-29 LAB — LIPID PANEL
Chol/HDL Ratio: 2.8 ratio (ref 0.0–5.0)
Cholesterol, Total: 170 mg/dL (ref 100–199)
HDL: 61 mg/dL (ref >39)
LDL Chol Calc (NIH): 93 mg/dL (ref 0–99)
Triglycerides: 87 mg/dL (ref 0–149)
VLDL Cholesterol Cal: 16 mg/dL (ref 5–40)

## 2019-06-20 ENCOUNTER — Other Ambulatory Visit: Payer: Self-pay

## 2019-06-20 DIAGNOSIS — E7800 Pure hypercholesterolemia, unspecified: Secondary | ICD-10-CM

## 2019-06-20 DIAGNOSIS — E7801 Familial hypercholesterolemia: Secondary | ICD-10-CM

## 2019-06-20 MED ORDER — EZETIMIBE 10 MG PO TABS: 10.0000 mg | ORAL_TABLET | Freq: Every day | ORAL | 3 refills | Status: DC

## 2019-10-19 ENCOUNTER — Other Ambulatory Visit: Payer: Self-pay | Admitting: Cardiology

## 2019-10-19 DIAGNOSIS — E7801 Familial hypercholesterolemia: Secondary | ICD-10-CM

## 2019-10-19 DIAGNOSIS — Z8249 Family history of ischemic heart disease and other diseases of the circulatory system: Secondary | ICD-10-CM

## 2020-05-30 ENCOUNTER — Other Ambulatory Visit: Payer: Self-pay | Admitting: Cardiology

## 2020-05-30 DIAGNOSIS — E7801 Familial hypercholesterolemia: Secondary | ICD-10-CM

## 2020-05-30 DIAGNOSIS — E7800 Pure hypercholesterolemia, unspecified: Secondary | ICD-10-CM

## 2020-07-15 ENCOUNTER — Other Ambulatory Visit: Payer: Self-pay | Admitting: Cardiology

## 2020-07-15 DIAGNOSIS — E7801 Familial hypercholesterolemia: Secondary | ICD-10-CM

## 2020-07-15 DIAGNOSIS — Z8249 Family history of ischemic heart disease and other diseases of the circulatory system: Secondary | ICD-10-CM

## 2020-09-04 ENCOUNTER — Telehealth: Payer: Self-pay | Admitting: Cardiology

## 2020-09-04 DIAGNOSIS — Z8249 Family history of ischemic heart disease and other diseases of the circulatory system: Secondary | ICD-10-CM

## 2020-09-04 DIAGNOSIS — E7800 Pure hypercholesterolemia, unspecified: Secondary | ICD-10-CM

## 2020-09-04 DIAGNOSIS — E7801 Familial hypercholesterolemia: Secondary | ICD-10-CM

## 2020-09-04 NOTE — Telephone Encounter (Signed)
*  STAT* If patient is at the pharmacy, call can be transferred to refill team.   1. Which medications need to be refilled? (please list name of each medication and dose if known) pravastatin (PRAVACHOL) 20 MG tablet ezetimibe (ZETIA) 10 MG tablet  2. Which pharmacy/location (including street and city if local pharmacy) is medication to be sent to? [facility] - [place], [place] - [address]  3. Do they need a 30 day or 90 day supply?90 day   pt. Has a appt with doctor 10/07/2020 at 8 am.pt is out of the medication

## 2020-09-06 MED ORDER — EZETIMIBE 10 MG PO TABS: 10.0000 mg | ORAL_TABLET | Freq: Every day | ORAL | 0 refills | Status: DC

## 2020-09-06 MED ORDER — PRAVASTATIN SODIUM 20 MG PO TABS: 20.0000 mg | ORAL_TABLET | Freq: Every day | ORAL | 0 refills | Status: DC

## 2020-10-07 ENCOUNTER — Ambulatory Visit (HOSPITAL_BASED_OUTPATIENT_CLINIC_OR_DEPARTMENT_OTHER): Payer: Managed Care, Other (non HMO) | Admitting: Cardiology

## 2020-10-07 NOTE — Progress Notes (Incomplete)
Cardiology Office Note:    Date:  10/07/2020   ID:  Martin Werner, DOB 1965/10/23, MRN [id number]  PCP:  Joycelyn Rua, MD  Cardiologist:  Jodelle Red, MD PhD  Referring MD: Joycelyn Rua, MD   CC: follow up  History of Present Illness:    Martin Werner is a 55 y.o. male seen for follow up of family history of CAD and primary prevention.  Cardiac history:  Strong family history, brother had stents (first MI age 75, stents after MI several years later), and his father had massive MI at age 36, then 3V CABG, passed away at age 89. Personal lipids:  Tchol 249, TG 78, HDL 54, LDL 180. No treatment for hypertension, no history of diabetes. At initial visit, hs-CRP normal at 0.83, lp(a) elevated at 97.9. Calcium score of 33, which was 81st percentile for age/gender control group.  Today:   He denies any palpitations, chest pain, or shortness of breath. No lightheadedness, headaches, syncope, orthopnea, or PND. Also has no lower extremity edema or exertional symptoms.   Past Surgical History:  Procedure Laterality Date   HAND SURGERY  1999   TONSILLECTOMY  1975    Current Medications: Current Outpatient Medications on File Prior to Visit  Medication Sig   ezetimibe (ZETIA) 10 MG tablet Take 1 tablet (10 mg total) by mouth daily. KEEP OV.   Ibuprofen (ADVIL) 200 MG CAPS Take 1 capsule by mouth as needed.   pravastatin (PRAVACHOL) 20 MG tablet Take 1 tablet (20 mg total) by mouth daily. KEEP OV.   No current facility-administered medications on file prior to visit.     Allergies:   Oxycodone-acetaminophen and Tyloxapol   Social History   Tobacco Use   Smoking status: Never   Smokeless tobacco: Never    Family History: The patient's family history includes Bone cancer in his brother; Breast cancer in his mother; COPD in his mother; Emphysema in his mother; Heart disease in his father; Hypothyroidism in his mother; Macular degeneration in his mother; Osteoporosis in  his mother; Skin cancer in his mother; Stroke in his mother. Of note, brother with prior MI on lovastatin high dose as he did not tolerate other statins.   ROS:   Please see the history of present illness.   (+) Additional pertinent ROS otherwise negative.  EKGs/Labs/Other Studies Reviewed:    The following studies were reviewed today:  Ca score 01/10/2018: Coronary calcium score of 33. This was 75 percentile for age and sex matched control.  EKG:  EKG is personally reviewed today.   10/07/2020: *** 04/21/2019: normal sinus rhytm  Recent Labs: No results found for requested labs within last 8760 hours.  Recent Lipid Panel    Component Value Date/Time   CHOL 170 05/29/2019 0928   TRIG 87 05/29/2019 0928   HDL 61 05/29/2019 0928   CHOLHDL 2.8 05/29/2019 0928   LDLCALC 93 05/29/2019 0928    Physical Exam:    VS:  There were no vitals taken for this visit.    Wt Readings from Last 3 Encounters:  04/21/19 165 lb 12.8 oz (75.2 kg)  04/06/18 160 lb (72.6 kg)  12/28/17 160 lb 9.6 oz (72.8 kg)    GEN: Well nourished, well developed in no acute distress HEENT: Normal, moist mucous membranes NECK: No JVD CARDIAC: regular rhythm, normal S1 and S2, no rubs or gallops. No murmur. VASCULAR: Radial and DP pulses 2+ bilaterally. No carotid bruits RESPIRATORY:  Clear to auscultation without rales, wheezing or rhonchi  ABDOMEN: Soft, non-tender, non-distended MUSCULOSKELETAL:  Ambulates independently SKIN: Warm and dry, no edema NEUROLOGIC:  Alert and oriented x 3. No focal neuro deficits noted. PSYCHIATRIC:  Normal affect   ASSESSMENT:    No diagnosis found.  PLAN:    Hypercholesterolemia, consistent with heterozygous familial hypercholesterolemia, and family history of CAD: -even with excellent lifestyle, baseline LDL has been 180 -father and brother have both had Mis and elevated cholesterol. -No one has had genetic testing as far as he knows.  -calcium score of 33, 81st  percentile for age. -hs-CRP is normal 0.83 -lp(a) was elevated at 97.9 -tolerating low dose pravastatin, but had myalgia on higher dose -discussed three options: stay on current pravastatin dose and monitor, change to low dose rosuvastatin and see if he tolerates, or add ezetimibe to his current regimen -with known FH and elevated calcium score, would like LDL <100. Will add ezetimibe and recheck lipids in 2 mos -if he cannot tolerate high enough dose statins or cannot achieve appropriate lipid lowering, will refer to lipid clinic for evaluation for PCSK9i  Coronary calcium on CT: needs aggressive prevention -recommend heart healthy/Mediterranean diet, with whole grains, fruits, vegetable, fish, lean meats, nuts, and olive oil. Limit salt. -recommend moderate walking, 3-5 times/week for 30-50 minutes each session. Aim for at least 150 minutes.week. Goal should be pace of 3 miles/hours, or walking 1.5 miles in 30 minutes -recommend avoidance of tobacco products. Avoid excess alcohol. -Additional risk factor control:  -Diabetes: A1c is N/A, no risk factors  -Lipids: as above  -Blood pressure control: at goal on no meds  -Weight: ideal range  Plan for follow up: 1 year or sooner PRN***  Jodelle Red, MD, PhD Uncertain   [facility]   Medication Adjustments/Labs and Tests Ordered: Current medicines are reviewed at length with the patient today.  Concerns regarding medicines are outlined above.  No orders of the defined types were placed in this encounter.  No orders of the defined types were placed in this encounter.   There are no Patient Instructions on file for this visit.  I,Mathew Stumpf,acting as a Neurosurgeon for [employer], MD.,have documented all relevant documentation on the behalf of Jodelle Red, MD,as directed by  Jodelle Red, MD while in the presence of Jodelle Red, MD.  ***  Signed, Jodelle Red, MD  PhD 10/07/2020 7:22 AM    [facility]

## 2020-10-31 ENCOUNTER — Ambulatory Visit (HOSPITAL_BASED_OUTPATIENT_CLINIC_OR_DEPARTMENT_OTHER): Payer: 59 | Admitting: Cardiology

## 2020-11-19 ENCOUNTER — Other Ambulatory Visit: Payer: Self-pay | Admitting: Cardiology

## 2020-11-19 DIAGNOSIS — E7800 Pure hypercholesterolemia, unspecified: Secondary | ICD-10-CM

## 2020-11-19 DIAGNOSIS — E7801 Familial hypercholesterolemia: Secondary | ICD-10-CM

## 2020-12-19 ENCOUNTER — Other Ambulatory Visit: Payer: Self-pay | Admitting: Cardiology

## 2020-12-19 DIAGNOSIS — Z8249 Family history of ischemic heart disease and other diseases of the circulatory system: Secondary | ICD-10-CM

## 2020-12-19 DIAGNOSIS — E7801 Familial hypercholesterolemia: Secondary | ICD-10-CM

## 2021-01-08 ENCOUNTER — Other Ambulatory Visit: Payer: Self-pay

## 2021-01-08 ENCOUNTER — Ambulatory Visit (INDEPENDENT_AMBULATORY_CARE_PROVIDER_SITE_OTHER): Payer: 59 | Admitting: Cardiology

## 2021-01-08 ENCOUNTER — Encounter (HOSPITAL_BASED_OUTPATIENT_CLINIC_OR_DEPARTMENT_OTHER): Payer: Self-pay | Admitting: Cardiology

## 2021-01-08 VITALS — BP 112/70 | HR 61 | Ht 72.0 in | Wt 165.1 lb

## 2021-01-08 DIAGNOSIS — Z8249 Family history of ischemic heart disease and other diseases of the circulatory system: Secondary | ICD-10-CM | POA: Diagnosis not present

## 2021-01-08 DIAGNOSIS — I2510 Atherosclerotic heart disease of native coronary artery without angina pectoris: Secondary | ICD-10-CM | POA: Diagnosis not present

## 2021-01-08 DIAGNOSIS — E7801 Familial hypercholesterolemia: Secondary | ICD-10-CM | POA: Diagnosis not present

## 2021-01-08 DIAGNOSIS — Z7189 Other specified counseling: Secondary | ICD-10-CM

## 2021-01-08 DIAGNOSIS — E7800 Pure hypercholesterolemia, unspecified: Secondary | ICD-10-CM

## 2021-01-08 NOTE — Progress Notes (Signed)
Cardiology Office Note:    Date:  01/08/2021   ID:  [name], DOB 09/26/1965, MRN [id number]  PCP:  Joycelyn Rua, MD  Cardiologist:  Jodelle Red, MD PhD  Referring MD: Joycelyn Rua, MD   CC: follow up  History of Present Illness:    Jaymen Fetch is a 55 y.o. male seen for follow up of family history of CAD and primary prevention.  Cardiac history:  Strong family history, brother had stents (first MI age 1, stents after MI several years later), and his father had massive MI at age 94, then 3V CABG, passed away at age 42. Personal lipids:  Tchol 249, TG 78, HDL 54, LDL 180. No treatment for hypertension, no history of diabetes. At initial visit, hs-CRP normal at 0.83, lp(a) elevated at 97.9. Calcium score of 33, which was 81st percentile for age/gender control group.  Today: Overall, he is feeling okay from a cardiovascular standpoint.   Recently he started a new job, and is often working 14 hour days. This has caused him consistent fatigue, myalgias, neck pain, and stress.    He continues to stay active and completes aerobic exercise.   Now he has started taking a fiber supplement. Lately he has also been cutting back on drinking beer.  He is using an inversion table, which has been helping.  We discussed importance of stress management today.  He denies any palpitations, chest pain, or shortness of breath. No lightheadedness, headaches, syncope, orthopnea, PND, or lower extremity edema.   Past Surgical History:  Procedure Laterality Date   HAND SURGERY  1999   TONSILLECTOMY  1975    Current Medications: Current Outpatient Medications on File Prior to Visit  Medication Sig   ezetimibe (ZETIA) 10 MG tablet TAKE 1 TABLET DAILY (KEEP OFFICE VISIT)   Ibuprofen 200 MG CAPS Take 1 capsule by mouth as needed.   pravastatin (PRAVACHOL) 20 MG tablet TAKE 1 TABLET DAILY (KEEP OFFICE VISIT)   No current facility-administered medications on file prior to visit.      Allergies:   Oxycodone-acetaminophen and Tyloxapol   Social History   Tobacco Use   Smoking status: Never   Smokeless tobacco: Never    Family History: The patient's family history includes Bone cancer in his brother; Breast cancer in his mother; COPD in his mother; Emphysema in his mother; Heart disease in his father; Hypothyroidism in his mother; Macular degeneration in his mother; Osteoporosis in his mother; Skin cancer in his mother; Stroke in his mother.  ROS:   Please see the history of present illness.   (+) Fatigue (+) Myalgias (+) Neck pain (+) Dysuria Additional pertinent ROS otherwise negative.  EKGs/Labs/Other Studies Reviewed:    The following studies were reviewed today:  Ca score 01/10/2018: Coronary calcium score of 33. This was 55 percentile for age and sex matched control.  EKG:  EKG is personally reviewed today.   01/08/2021: NSR at 61 bpm 04/21/2019: normal sinus rhytm  Recent Labs: No results found for requested labs within last 8760 hours.  Recent Lipid Panel    Component Value Date/Time   CHOL 170 05/29/2019 0928   TRIG 87 05/29/2019 0928   HDL 61 05/29/2019 0928   CHOLHDL 2.8 05/29/2019 0928   LDLCALC 93 05/29/2019 0928    Physical Exam:    VS:  BP 112/70 (BP Location: Right Arm, Patient Position: Sitting, Cuff Size: Normal)   Pulse 61   Ht 6' (1.829 m)   Wt 165 lb 1.6 oz (  74.9 kg)   SpO2 98%   BMI 22.39 kg/m     Wt Readings from Last 3 Encounters:  01/08/21 165 lb 1.6 oz (74.9 kg)  04/21/19 165 lb 12.8 oz (75.2 kg)  04/06/18 160 lb (72.6 kg)    GEN: Well nourished, well developed in no acute distress HEENT: Normal, moist mucous membranes NECK: No JVD CARDIAC: regular rhythm, normal S1 and S2, no rubs or gallops. No murmur. VASCULAR: Radial and DP pulses 2+ bilaterally. No carotid bruits RESPIRATORY:  Clear to auscultation without rales, wheezing or rhonchi  ABDOMEN: Soft, non-tender, non-distended MUSCULOSKELETAL:   Ambulates independently SKIN: Warm and dry, no edema NEUROLOGIC:  Alert and oriented x 3. No focal neuro deficits noted. PSYCHIATRIC:  Normal affect   ASSESSMENT:    1. Coronary artery calcification seen on CT scan   2. Familial hypercholesterolemia   3. Family history of coronary arteriosclerosis   4. Hypercholesteremia   5. Cardiac risk counseling   6. Counseling on health promotion and disease prevention     PLAN:    Hypercholesterolemia, consistent with heterozygous familial hypercholesterolemia, and family history of CAD: -even with excellent lifestyle, baseline LDL has been 180 -father and brother have both had Mis and elevated cholesterol. -No one has had genetic testing as far as he knows.  -calcium score of 33, 81st percentile for age. -hs-CRP is normal 0.83 -lp(a) was elevated at 97.9 -tolerating low dose pravastatin, but had myalgia on higher dose. Tolerating addition of ezetimibe -based on recent data, would aim for LDL <70 if possible -if he cannot tolerate high enough dose statins or cannot achieve appropriate lipid lowering, will refer to lipid clinic for evaluation for PCSK9i  He is having labs drawn with his PCP on 01/09/21. Will look for these results.  Coronary calcium on CT: needs aggressive prevention -recommend heart healthy/Mediterranean diet, with whole grains, fruits, vegetable, fish, lean meats, nuts, and olive oil. Limit salt. -recommend moderate walking, 3-5 times/week for 30-50 minutes each session. Aim for at least 150 minutes.week. Goal should be pace of 3 miles/hours, or walking 1.5 miles in 30 minutes -recommend avoidance of tobacco products. Avoid excess alcohol.  Plan for follow up: 1 year or sooner PRN  Jodelle Red, MD, PhD [place]  [facility]   Medication Adjustments/Labs and Tests Ordered: Current medicines are reviewed at length with the patient today.  Concerns regarding medicines are outlined above.   Orders Placed  This Encounter  Procedures   EKG 12-Lead    No orders of the defined types were placed in this encounter.  Patient Instructions  Medication Instructions:  Your Physician recommend you continue on your current medication as directed.    *If you need a refill on your cardiac medications before your next appointment, please call your pharmacy*   Lab Work: None ordered today   Testing/Procedures: None ordered today   Follow-Up: At [facility], you and your health needs are our priority.  As part of our continuing mission to provide you with exceptional heart care, we have created designated Provider Care Teams.  These Care Teams include your primary Cardiologist (physician) and Advanced Practice Providers (APPs -  Physician Assistants and Nurse Practitioners) who all work together to provide you with the care you need, when you need it.  We recommend signing up for the patient portal called "MyChart".  Sign up information is provided on this After Visit Summary.  MyChart is used to connect with patients for Virtual Visits (Telemedicine).  Patients  are able to view lab/test results, encounter notes, upcoming appointments, etc.  Non-urgent messages can be sent to your provider as well.   To learn more about what you can do with MyChart, go to [email or web address].    Your next appointment:   1 year(s)  The format for your next appointment:   In Person  Provider:   Jodelle Red, MD      [facility],acting as a scribe for Jodelle Red, MD.,have documented all relevant documentation on the behalf of Jodelle Red, MD,as directed by  Jodelle Red, MD while in the presence of Jodelle Red, MD.  I, Jodelle Red, MD, have reviewed all documentation for this visit. The documentation on 01/08/21 for the exam, diagnosis, procedures, and orders are all accurate and complete.   Signed, Jodelle Red, MD PhD 01/08/2021  12:25 PM    [facility]

## 2021-01-08 NOTE — Patient Instructions (Signed)

## 2021-01-19 ENCOUNTER — Encounter (HOSPITAL_BASED_OUTPATIENT_CLINIC_OR_DEPARTMENT_OTHER): Payer: Self-pay

## 2021-01-20 NOTE — Telephone Encounter (Signed)
Just an FYI

## 2021-03-19 ENCOUNTER — Other Ambulatory Visit: Payer: Self-pay | Admitting: Cardiology

## 2021-03-19 DIAGNOSIS — E7801 Familial hypercholesterolemia: Secondary | ICD-10-CM

## 2021-03-19 DIAGNOSIS — Z8249 Family history of ischemic heart disease and other diseases of the circulatory system: Secondary | ICD-10-CM

## 2021-06-10 ENCOUNTER — Other Ambulatory Visit: Payer: Self-pay | Admitting: Cardiology

## 2021-06-10 DIAGNOSIS — E7801 Familial hypercholesterolemia: Secondary | ICD-10-CM

## 2021-06-10 DIAGNOSIS — E7800 Pure hypercholesterolemia, unspecified: Secondary | ICD-10-CM

## 2022-01-30 ENCOUNTER — Ambulatory Visit (INDEPENDENT_AMBULATORY_CARE_PROVIDER_SITE_OTHER): Payer: 59 | Admitting: Cardiology

## 2022-01-30 ENCOUNTER — Encounter (HOSPITAL_BASED_OUTPATIENT_CLINIC_OR_DEPARTMENT_OTHER): Payer: Self-pay | Admitting: Cardiology

## 2022-01-30 VITALS — BP 121/79 | HR 58 | Ht 72.0 in | Wt 167.4 lb

## 2022-01-30 DIAGNOSIS — Z8249 Family history of ischemic heart disease and other diseases of the circulatory system: Secondary | ICD-10-CM

## 2022-01-30 DIAGNOSIS — E7800 Pure hypercholesterolemia, unspecified: Secondary | ICD-10-CM | POA: Diagnosis not present

## 2022-01-30 DIAGNOSIS — I2510 Atherosclerotic heart disease of native coronary artery without angina pectoris: Secondary | ICD-10-CM | POA: Diagnosis not present

## 2022-01-30 DIAGNOSIS — E7801 Familial hypercholesterolemia: Secondary | ICD-10-CM | POA: Diagnosis not present

## 2022-01-30 DIAGNOSIS — Z7189 Other specified counseling: Secondary | ICD-10-CM

## 2022-01-30 NOTE — Progress Notes (Signed)
Cardiology Office Note:    Date:  01/30/2022   ID:  Martin Werner, DOB 1965/06/25, MRN [id number]  PCP:  Martin Rua, MD  Cardiologist:  Martin Red, MD PhD  Referring MD: Martin Rua, MD   CC: follow up  History of Present Illness:    Martin Werner is a 56 y.o. male seen for follow up of likely familial heterozygous hypercholesterolemia, coronary calcification, family history of CAD and primary prevention.  Cardiac history:  Strong family history, brother had stents (first MI age 33, stents after MI several years later), and his father had massive MI at age 80, then 3V CABG, passed away at age 4. Personal lipids:  Tchol 249, TG 78, HDL 54, LDL 180 at baseline. No treatment for hypertension, no history of diabetes. At initial visit, hs-CRP normal at 0.83, lp(a) elevated at 97.9. Calcium score of 33, which was 81st percentile for age/gender control group.  Today: Doing well overall. He notes that his LFTs have been chronically but only mildly elevated. He will send me these labs. Rarely takes ibuprofen for muscle aches. Tolerating pravastatin and ezetimibe.  Enjoying recent job change. Overall feels he is managing stress as best he can. Active at his job. Has aches/pains but no other limitations.   Denies chest pain, shortness of breath at rest or with normal exertion. No PND, orthopnea, LE edema or unexpected weight gain. No syncope or palpitations.    Past Surgical History:  Procedure Laterality Date   HAND SURGERY  1999   TONSILLECTOMY  1975    Current Medications: Current Outpatient Medications on File Prior to Visit  Medication Sig   ezetimibe (ZETIA) 10 MG tablet TAKE 1 TABLET DAILY (KEEP OFFICE VISIT)   Ibuprofen 200 MG CAPS Take 1 capsule by mouth as needed.   pravastatin (PRAVACHOL) 20 MG tablet TAKE 1 TABLET DAILY (KEEP OFFICE VISIT WITH DR Martin Werner IN [place])   No current facility-administered medications on file prior to visit.     Allergies:    Oxycodone-acetaminophen and Tyloxapol   Social History   Tobacco Use   Smoking status: Never   Smokeless tobacco: Never    Family History: The patient's family history includes Bone cancer in his brother; Breast cancer in his mother; COPD in his mother; Emphysema in his mother; Heart disease in his father; Hypothyroidism in his mother; Macular degeneration in his mother; Osteoporosis in his mother; Skin cancer in his mother; Stroke in his mother.  ROS:   Please see the history of present illness.   Additional pertinent ROS otherwise negative.  EKGs/Labs/Other Studies Reviewed:    The following studies were reviewed today:  Ca score 01/10/2018: Coronary calcium score of 33. This was 59 percentile for age and sex matched control.  EKG:  EKG is personally reviewed today.   01/30/22: sinus bradycardia at 58 bpm 01/08/2021: NSR at 61 bpm 04/21/2019: normal sinus rhytm  Recent Labs: No results found for requested labs within last 365 days.  Recent Lipid Panel    Component Value Date/Time   CHOL 170 05/29/2019 0928   TRIG 87 05/29/2019 0928   HDL 61 05/29/2019 0928   CHOLHDL 2.8 05/29/2019 0928   LDLCALC 93 05/29/2019 0928    Physical Exam:    VS:  BP 121/79 (BP Location: Right Arm, Patient Position: Sitting, Cuff Size: Normal)   Pulse (!) 58   Ht 6' (1.829 m)   Wt 167 lb 6.4 oz (75.9 kg)   BMI 22.70 kg/m     Wt  Readings from Last 3 Encounters:  01/30/22 167 lb 6.4 oz (75.9 kg)  01/08/21 165 lb 1.6 oz (74.9 kg)  04/21/19 165 lb 12.8 oz (75.2 kg)    GEN: Well nourished, well developed in no acute distress HEENT: Normal, moist mucous membranes NECK: No JVD CARDIAC: regular rhythm, normal S1 and S2, no rubs or gallops. No murmur. VASCULAR: Radial and DP pulses 2+ bilaterally. No carotid bruits RESPIRATORY:  Clear to auscultation without rales, wheezing or rhonchi  ABDOMEN: Soft, non-tender, non-distended MUSCULOSKELETAL:  Ambulates independently SKIN: Warm and dry,  no edema NEUROLOGIC:  Alert and oriented x 3. No focal neuro deficits noted. PSYCHIATRIC:  Normal affect    ASSESSMENT:    1. Coronary artery calcification seen on CT scan   2. Hypercholesteremia   3. Familial hypercholesterolemia   4. Family history of coronary arteriosclerosis   5. Cardiac risk counseling   6. Counseling on health promotion and disease prevention    PLAN:    Hypercholesterolemia, consistent with possile heterozygous familial hypercholesterolemia family history of CAD Coronary artery calcification -even with excellent lifestyle, baseline LDL has been 180 -CAC 33 12/2017, 81st percentile -father and brother have both had Mis and elevated cholesterol. -No one has had genetic testing as far as he knows.  -hs-CRP is normal 0.83 -lp(a) was mildly elevated at 97.9 -tolerating low dose pravastatin, but had myalgia on higher dose. -Tolerating ezetimibe -based on recent data, would aim for LDL <70 if possible. Most recent LDL 107. He would like to continue current plan. -if he cannot tolerate high enough dose statins or cannot achieve appropriate lipid lowering, consider future lipid clinic for evaluation for PCSK9i if interested  Lipids: 01/14/22: Tchol 180, TG 74, HDL 58, LDL 107 ([facility]) 05/29/19: Tchol 170, TG 87, HDL 61, LDL 93 04/06/18: Tchol 204, TG 78, HDL 56, LDL 132 12/28/17: Tchol 236, TG 135, HDL 52, LDL 157  Lifestyle/prevention counseling -recommend heart healthy/Mediterranean diet, with whole grains, fruits, vegetable, fish, lean meats, nuts, and olive oil. Limit salt. -recommend moderate walking, 3-5 times/week for 30-50 minutes each session. Aim for at least 150 minutes.week. Goal should be pace of 3 miles/hours, or walking 1.5 miles in 30 minutes -recommend avoidance of tobacco products. Avoid excess alcohol.  Plan for follow up: 1 year or sooner PRN  Martin Red, MD, PhD   [facility]   Medication Adjustments/Labs and Tests  Ordered: Current medicines are reviewed at length with the patient today.  Concerns regarding medicines are outlined above.   Orders Placed This Encounter  Procedures   EKG 12-Lead   No orders of the defined types were placed in this encounter.  Patient Instructions  Medication Instructions:  Continue current medications  *If you need a refill on your cardiac medications before your next appointment, please call your pharmacy*   Lab Work: None Ordered   Testing/Procedures: None Ordered   Follow-Up: At [facility], you and your health needs are our priority.  As part of our continuing mission to provide you with exceptional heart care, we have created designated Provider Care Teams.  These Care Teams include your primary Cardiologist (physician) and Advanced Practice Providers (APPs -  Physician Assistants and Nurse Practitioners) who all work together to provide you with the care you need, when you need it.  We recommend signing up for the patient portal called "MyChart".  Sign up information is provided on this After Visit Summary.  MyChart is used to connect with patients for Virtual Visits (  Telemedicine).  Patients are able to view lab/test results, encounter notes, upcoming appointments, etc.  Non-urgent messages can be sent to your provider as well.   To learn more about what you can do with MyChart, go to [email or web address].    Your next appointment:   1 year(s)  The format for your next appointment:   In Person  Provider:   Jodelle Red, MD              Signed, Martin Red, MD PhD 01/30/2022 5:59 PM    [facility]

## 2022-01-30 NOTE — Patient Instructions (Signed)
Medication Instructions:  Continue current medications  *If you need a refill on your cardiac medications before your next appointment, please call your pharmacy*   Lab Work: None Ordered   Testing/Procedures: None Ordered   Follow-Up: At [facility], you and your health needs are our priority.  As part of our continuing mission to provide you with exceptional heart care, we have created designated Provider Care Teams.  These Care Teams include your primary Cardiologist (physician) and Advanced Practice Providers (APPs -  Physician Assistants and Nurse Practitioners) who all work together to provide you with the care you need, when you need it.  We recommend signing up for the patient portal called "MyChart".  Sign up information is provided on this After Visit Summary.  MyChart is used to connect with patients for Virtual Visits (Telemedicine).  Patients are able to view lab/test results, encounter notes, upcoming appointments, etc.  Non-urgent messages can be sent to your provider as well.   To learn more about what you can do with MyChart, go to [email or web address].    Your next appointment:   1 year(s)  The format for your next appointment:   In Person  Provider:   Jodelle Red, MD

## 2022-03-09 ENCOUNTER — Other Ambulatory Visit: Payer: Self-pay | Admitting: Cardiology

## 2022-03-09 DIAGNOSIS — E7800 Pure hypercholesterolemia, unspecified: Secondary | ICD-10-CM

## 2022-03-09 DIAGNOSIS — E7801 Familial hypercholesterolemia: Secondary | ICD-10-CM

## 2022-03-09 NOTE — Telephone Encounter (Signed)
Rx(s) sent to pharmacy electronically.  

## 2022-03-16 ENCOUNTER — Other Ambulatory Visit: Payer: Self-pay | Admitting: Cardiology

## 2022-03-16 DIAGNOSIS — Z8249 Family history of ischemic heart disease and other diseases of the circulatory system: Secondary | ICD-10-CM

## 2022-03-16 DIAGNOSIS — E7801 Familial hypercholesterolemia: Secondary | ICD-10-CM

## 2022-03-16 NOTE — Telephone Encounter (Signed)
Rx request sent to pharmacy.  

## 2022-09-10 ENCOUNTER — Other Ambulatory Visit: Payer: Self-pay | Admitting: Cardiology

## 2022-09-10 DIAGNOSIS — E7801 Familial hypercholesterolemia: Secondary | ICD-10-CM

## 2022-09-10 DIAGNOSIS — Z8249 Family history of ischemic heart disease and other diseases of the circulatory system: Secondary | ICD-10-CM

## 2022-09-10 NOTE — Telephone Encounter (Signed)
Rx request sent to pharmacy.  

## 2022-12-02 ENCOUNTER — Other Ambulatory Visit: Payer: Self-pay | Admitting: Cardiology

## 2022-12-02 DIAGNOSIS — E7801 Familial hypercholesterolemia: Secondary | ICD-10-CM

## 2022-12-02 DIAGNOSIS — E7800 Pure hypercholesterolemia, unspecified: Secondary | ICD-10-CM

## 2023-03-02 ENCOUNTER — Other Ambulatory Visit: Payer: Self-pay | Admitting: Cardiology

## 2023-03-02 DIAGNOSIS — E7801 Familial hypercholesterolemia: Secondary | ICD-10-CM

## 2023-03-02 DIAGNOSIS — E7800 Pure hypercholesterolemia, unspecified: Secondary | ICD-10-CM

## 2023-03-08 ENCOUNTER — Ambulatory Visit (HOSPITAL_BASED_OUTPATIENT_CLINIC_OR_DEPARTMENT_OTHER): Payer: 59 | Admitting: Cardiology

## 2023-03-09 ENCOUNTER — Other Ambulatory Visit: Payer: Self-pay | Admitting: Cardiology

## 2023-03-09 DIAGNOSIS — E7801 Familial hypercholesterolemia: Secondary | ICD-10-CM

## 2023-03-09 DIAGNOSIS — Z8249 Family history of ischemic heart disease and other diseases of the circulatory system: Secondary | ICD-10-CM

## 2023-06-14 ENCOUNTER — Encounter (HOSPITAL_BASED_OUTPATIENT_CLINIC_OR_DEPARTMENT_OTHER): Payer: Self-pay | Admitting: Cardiology

## 2023-06-14 ENCOUNTER — Ambulatory Visit (HOSPITAL_BASED_OUTPATIENT_CLINIC_OR_DEPARTMENT_OTHER): Payer: 59 | Admitting: Cardiology

## 2023-06-14 VITALS — BP 108/52 | HR 71 | Ht 72.0 in | Wt 158.3 lb

## 2023-06-14 DIAGNOSIS — Z7189 Other specified counseling: Secondary | ICD-10-CM

## 2023-06-14 DIAGNOSIS — E7801 Familial hypercholesterolemia: Secondary | ICD-10-CM

## 2023-06-14 DIAGNOSIS — Z8249 Family history of ischemic heart disease and other diseases of the circulatory system: Secondary | ICD-10-CM | POA: Diagnosis not present

## 2023-06-14 DIAGNOSIS — I2510 Atherosclerotic heart disease of native coronary artery without angina pectoris: Secondary | ICD-10-CM | POA: Diagnosis not present

## 2023-06-14 NOTE — Progress Notes (Signed)
 Cardiology Office Note:  .   Date:  06/14/2023  ID:  Martin Werner, DOB 12/18/1965, MRN [id number] PCP: Wyn Heater, MD  [facility] Providers Cardiologist:  Sheryle Donning, MD {  History of Present Illness: .   Martin Werner is a 58 y.o. male seen for follow up of likely familial heterozygous hypercholesterolemia, coronary calcification, family history of CAD and primary prevention.   Cardiac history:  Strong family history, brother had stents (first MI age 78, stents after MI several years later), and his father had massive MI at age 57, then 3V CABG, passed away at age 54. Personal lipids:  Tchol 249, TG 78, HDL 54, LDL 180 at baseline. No treatment for hypertension, no history of diabetes. At initial visit, hs-CRP normal at 0.83, lp(a) elevated at 97.9. Calcium score of 33, which was 81st percentile for age/gender control group.  Today: Started fish oil for dry eyes. Doing well with this. Started CoQ10 for generalized muscle aches, especially in the neck. Also started prostate supplement.   Has been working to increase activity. Walking 4 miles at a time. Also doing tabata (high intensity interval) workouts.  Reviewed lipid numbers, meds, guidelines.  ROS: Denies chest pain, shortness of breath at rest or with normal exertion. No PND, orthopnea, LE edema or unexpected weight gain. No syncope or palpitations. ROS otherwise negative except as noted.   Studies Reviewed: Aaron Aas    EKG:  EKG Interpretation Date/Time:  Monday June 14 2023 16:08:58 EDT Ventricular Rate:  71 PR Interval:  172 QRS Duration:  96 QT Interval:  382 QTC Calculation: 415 R Axis:   82  Text Interpretation: Normal sinus rhythm Normal ECG Confirmed by Sheryle Donning [phone]) on 06/14/2023 4:23:37 PM    Physical Exam:   VS:  BP (!) 108/52   Pulse 71   Ht 6' (1.829 m)   Wt 158 lb 4.8 oz (71.8 kg)   SpO2 97%   BMI 21.47 kg/m    Wt Readings from Last 3 Encounters:  06/14/23 158 lb 4.8 oz  (71.8 kg)  01/30/22 167 lb 6.4 oz (75.9 kg)  01/08/21 165 lb 1.6 oz (74.9 kg)    GEN: Well nourished, well developed in no acute distress HEENT: Normal, moist mucous membranes NECK: No JVD CARDIAC: regular rhythm, normal S1 and S2, no rubs or gallops. No murmur. VASCULAR: Radial and DP pulses 2+ bilaterally. No carotid bruits RESPIRATORY:  Clear to auscultation without rales, wheezing or rhonchi  ABDOMEN: Soft, non-tender, non-distended MUSCULOSKELETAL:  Ambulates independently SKIN: Warm and dry, no edema NEUROLOGIC:  Alert and oriented x 3. No focal neuro deficits noted. PSYCHIATRIC:  Normal affect    ASSESSMENT AND PLAN: .    Hypercholesterolemia, consistent with possile heterozygous familial hypercholesterolemia family history of CAD Coronary artery calcification -even with excellent lifestyle, baseline LDL has been 180 -CAC 33 12/2017, 81st percentile -father and brother have both had Mis and elevated cholesterol. -No one has had genetic testing as far as he knows.  -hs-CRP is normal 0.83 -lp(a) was mildly elevated at 97.9 -tolerating low dose pravastatin, but had myalgia on higher dose.  -tolerating ezetimibe -based on recent data, would aim for LDL <70 if possible. Most recent LDL 114 01/2023 per [facility]. He would like to continue current plan. -if he cannot tolerate high enough dose statins or cannot achieve appropriate lipid lowering, consider future lipid clinic for evaluation for PCSK9i if interested   Lipids: 01/21/23: Tchol 183, TG 63, HDL 57, LDL 114 ([facility]) 01/14/22:  Tchol 180, TG 74, HDL 58, LDL 107 ([facility]) 05/29/19: Tchol 170, TG 87, HDL 61, LDL 93 04/06/18: Tchol 204, TG 78, HDL 56, LDL 132 12/28/17: Tchol 236, TG 135, HDL 52, LDL 157  CV risk counseling and prevention -recommend heart healthy/Mediterranean diet, with whole grains, fruits, vegetable, fish, lean meats, nuts, and olive oil. Limit salt. -recommend moderate walking, 3-5 times/week for 30-50 minutes each  session. Aim for at least 150 minutes.week. Goal should be pace of 3 miles/hours, or walking 1.5 miles in 30 minutes -recommend avoidance of tobacco products. Avoid excess alcohol.   Dispo: 1 year or sooner as needed  Signed, Sheryle Donning, MD   Sheryle Donning, MD, PhD, [facility] [place]  [facility]  [place]  [facility] V[facility] at [facility] at [facility] [address], [address] [place], [place] [address] [phone]

## 2023-06-14 NOTE — Patient Instructions (Signed)
 Medication Instructions:  No Changes *If you need a refill on your cardiac medications before your next appointment, please call your pharmacy*  Lab Work: No labs If you have labs (blood work) drawn today and your tests are completely normal, you will receive your results only by: MyChart Message (if you have MyChart) OR A paper copy in the mail If you have any lab test that is abnormal or we need to change your treatment, we will call you to review the results.  Testing/Procedures: No Testing  Follow-Up: At [facility], you and your health needs are our priority.  As part of our continuing mission to provide you with exceptional heart care, our providers are all part of one team.  This team includes your primary Cardiologist (physician) and Advanced Practice Providers or APPs (Physician Assistants and Nurse Practitioners) who all work together to provide you with the care you need, when you need it.  Your next appointment:   1 year(s)  Provider:   Sheryle Donning, MD We recommend signing up for the patient portal called "MyChart".  Sign up information is provided on this After Visit Summary.  MyChart is used to connect with patients for Virtual Visits (Telemedicine).  Patients are able to view lab/test results, encounter notes, upcoming appointments, etc.  Non-urgent messages can be sent to your provider as well.   To learn more about what you can do with MyChart, go to [email or web address].

## 2024-03-01 ENCOUNTER — Other Ambulatory Visit: Payer: Self-pay | Admitting: Cardiology

## 2024-03-01 DIAGNOSIS — E7800 Pure hypercholesterolemia, unspecified: Secondary | ICD-10-CM

## 2024-03-01 DIAGNOSIS — E78019 Familial hypercholesterolemia, unspecified: Secondary | ICD-10-CM

## 2024-03-04 ENCOUNTER — Other Ambulatory Visit: Payer: Self-pay | Admitting: Cardiology

## 2024-03-04 DIAGNOSIS — E78019 Familial hypercholesterolemia, unspecified: Secondary | ICD-10-CM

## 2024-03-04 DIAGNOSIS — Z8249 Family history of ischemic heart disease and other diseases of the circulatory system: Secondary | ICD-10-CM
